# Patient Record
Sex: Female | Born: 1980 | Race: Black or African American | Hispanic: No | Marital: Married | Smoking: Never smoker
Health system: Southern US, Community
[De-identification: ages and names within clinical notes are randomized; demographics above are authoritative.]

## PROBLEM LIST (undated history)

## (undated) DIAGNOSIS — R87619 Unspecified abnormal cytological findings in specimens from cervix uteri: Secondary | ICD-10-CM

## (undated) DIAGNOSIS — O139 Gestational [pregnancy-induced] hypertension without significant proteinuria, unspecified trimester: Secondary | ICD-10-CM

## (undated) DIAGNOSIS — L309 Dermatitis, unspecified: Secondary | ICD-10-CM

## (undated) DIAGNOSIS — IMO0002 Reserved for concepts with insufficient information to code with codable children: Secondary | ICD-10-CM

## (undated) DIAGNOSIS — I10 Essential (primary) hypertension: Secondary | ICD-10-CM

## (undated) HISTORY — DX: Dermatitis, unspecified: L30.9

## (undated) HISTORY — PX: NO PAST SURGERIES: SHX2092

---
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
## 2000-05-28 ENCOUNTER — Ambulatory Visit (HOSPITAL_COMMUNITY): Admission: EM | Admit: 2000-05-28 | Discharge: 2000-05-28 | Payer: Self-pay | Admitting: *Deleted

## 2000-10-03 ENCOUNTER — Emergency Department (HOSPITAL_COMMUNITY): Admission: EM | Admit: 2000-10-03 | Discharge: 2000-10-04 | Payer: Self-pay

## 2000-11-23 ENCOUNTER — Encounter: Payer: Self-pay | Admitting: Emergency Medicine

## 2000-11-23 ENCOUNTER — Emergency Department (HOSPITAL_COMMUNITY): Admission: EM | Admit: 2000-11-23 | Discharge: 2000-11-24 | Payer: Self-pay | Admitting: Emergency Medicine

## 2001-01-06 ENCOUNTER — Encounter: Admission: RE | Admit: 2001-01-06 | Discharge: 2001-03-17 | Payer: Self-pay | Admitting: Family Medicine

## 2001-03-07 ENCOUNTER — Emergency Department (HOSPITAL_COMMUNITY): Admission: EM | Admit: 2001-03-07 | Discharge: 2001-03-07 | Payer: Self-pay | Admitting: Emergency Medicine

## 2001-11-06 ENCOUNTER — Emergency Department (HOSPITAL_COMMUNITY): Admission: EM | Admit: 2001-11-06 | Discharge: 2001-11-06 | Payer: Self-pay | Admitting: Emergency Medicine

## 2002-09-15 ENCOUNTER — Emergency Department (HOSPITAL_COMMUNITY): Admission: EM | Admit: 2002-09-15 | Discharge: 2002-09-15 | Payer: Self-pay | Admitting: Emergency Medicine

## 2002-12-24 ENCOUNTER — Encounter: Admission: RE | Admit: 2002-12-24 | Discharge: 2002-12-24 | Payer: Self-pay | Admitting: Psychiatry

## 2004-04-10 ENCOUNTER — Emergency Department (HOSPITAL_COMMUNITY): Admission: EM | Admit: 2004-04-10 | Discharge: 2004-04-10 | Payer: Self-pay | Admitting: Emergency Medicine

## 2009-11-16 ENCOUNTER — Encounter: Admission: RE | Admit: 2009-11-16 | Discharge: 2009-11-16 | Payer: Self-pay | Admitting: Family Medicine

## 2010-02-08 LAB — TYPE AND SCREEN: Antibody Screen: NEGATIVE

## 2010-02-08 LAB — ABO/RH: RH Type: POSITIVE

## 2010-02-08 LAB — HIV ANTIBODY (ROUTINE TESTING W REFLEX): HIV: NONREACTIVE

## 2010-02-18 NOTE — L&D Delivery Note (Signed)
Delivery Note At 3:42 PM a viable female was delivered via Vaginal, Spontaneous Delivery (Presentation: Left Occiput Anterior).  APGAR: 9, 9; weight 7 lb 2.5 oz (3246 g).   Placenta status: Intact, Spontaneous.  Cord: 3 vessels with the following complications: None.  Anesthesia: None  Episiotomy: None Lacerations: None Suture Repair: none Est. Blood Loss (mL): 300cc  Mom to postpartum.  Baby to nursery-stable.  Alison Murillo Y 09/27/2010, 2:02 AM

## 2010-03-11 ENCOUNTER — Encounter: Payer: Self-pay | Admitting: Obstetrics and Gynecology

## 2010-05-20 ENCOUNTER — Inpatient Hospital Stay (HOSPITAL_COMMUNITY)
Admission: AD | Admit: 2010-05-20 | Discharge: 2010-05-20 | Disposition: A | Discharge status: Home / Self Care | Payer: BC Managed Care – PPO | Source: Ambulatory Visit | Attending: Obstetrics and Gynecology | Admitting: Obstetrics and Gynecology

## 2010-05-20 DIAGNOSIS — R109 Unspecified abdominal pain: Secondary | ICD-10-CM | POA: Insufficient documentation

## 2010-05-20 DIAGNOSIS — O26859 Spotting complicating pregnancy, unspecified trimester: Secondary | ICD-10-CM | POA: Insufficient documentation

## 2010-05-20 LAB — WET PREP, GENITAL
Clue Cells Wet Prep HPF POC: NONE SEEN
Yeast Wet Prep HPF POC: NONE SEEN

## 2010-05-20 LAB — URINE MICROSCOPIC-ADD ON

## 2010-05-20 LAB — URINALYSIS, ROUTINE W REFLEX MICROSCOPIC
Glucose, UA: NEGATIVE mg/dL
Ketones, ur: NEGATIVE mg/dL
Leukocytes, UA: NEGATIVE
Protein, ur: NEGATIVE mg/dL
Urobilinogen, UA: 0.2 mg/dL (ref 0.0–1.0)

## 2010-05-21 LAB — GC/CHLAMYDIA PROBE AMP, GENITAL: GC Probe Amp, Genital: NEGATIVE

## 2010-06-05 ENCOUNTER — Inpatient Hospital Stay (HOSPITAL_COMMUNITY)
Admission: AD | Admit: 2010-06-05 | Discharge: 2010-06-05 | Disposition: A | Discharge status: Home / Self Care | Payer: BC Managed Care – PPO | Source: Ambulatory Visit | Attending: Obstetrics and Gynecology | Admitting: Obstetrics and Gynecology

## 2010-06-05 DIAGNOSIS — O4700 False labor before 37 completed weeks of gestation, unspecified trimester: Secondary | ICD-10-CM | POA: Insufficient documentation

## 2010-06-05 LAB — URINALYSIS, ROUTINE W REFLEX MICROSCOPIC
Glucose, UA: NEGATIVE mg/dL
Hgb urine dipstick: NEGATIVE
Ketones, ur: NEGATIVE mg/dL
Nitrite: NEGATIVE
Protein, ur: NEGATIVE mg/dL
Specific Gravity, Urine: 1.015 (ref 1.005–1.030)
Urobilinogen, UA: 0.2 mg/dL (ref 0.0–1.0)
pH: 6.5 (ref 5.0–8.0)

## 2010-06-05 LAB — WET PREP, GENITAL
Clue Cells Wet Prep HPF POC: NONE SEEN
Trich, Wet Prep: NONE SEEN

## 2010-06-05 LAB — URINE MICROSCOPIC-ADD ON

## 2010-07-06 NOTE — Op Note (Signed)
[facility]  Patient:    Alison Murillo, Alison Murillo                    MRN: [id number] Proc. Date: 05/28/00 Adm. Date:  [id number] Attending:  Ephriam Knuckles H                           Operative Report  PREOPERATIVE DIAGNOSIS:  Left Bartholins abscess.  POSTOPERATIVE DIAGNOSIS:  Left Bartholins abscess.  PROCEDURE:  Marsupialization, left Bartholins abscess.  SURGEON:  Sherry A. Rosalio Macadamia, M.D.  ANESTHESIA:  General.  INDICATIONS:  This is a 31 year old G1, P1-0-0-1 who has had left labial pain and swelling for two days.  The pain got excruciatingly severe on April 9th. Patient was seen in the emergency room for evaluation.  Left labial Bartholins abscess was diagnosed and patient is brought to the operating room for marsupialization.  FINDINGS:  Left labial Bartholins abscess, approximately 5 cm.  PROCEDURE:  Patient was brought into the operating room and given adequate general anesthesia.  She was placed in the dorsal lithotomy position.  Her perineum was washed with Betadine.  A speculum was placed within the vagina. Wet prep was obtained.  GC and Chlamydia culture of the cervix was obtained. Speculum was removed.  The abscess was evaluated.  It was felt that the left labia minora could be opened on the vaginal side for drainage.  An elliptical incision was made approximately 2 to 3 cm.  The epithelium was excised.  The underlying tissues were then incised in a linear fashion.  Large amount of pus and blood were evacuated.  Culture was obtained.  Using #4-0 chromic, the tissues were marsupialized for closure in interrupted stitches for adequate hemostasis.  The abscess was left open in this fashion.  Adequate hemostasis was present.  Patient was taken out of the dorsal lithotomy position.  She was awakened.  She was extubated.  She was moved from the operating table to a stretcher in stable condition.  Complications were none.  Estimated blood loss:   Less than 5 cc. DD:  05/28/00 TD:  05/28/00 Job: [id number]

## 2010-09-13 ENCOUNTER — Ambulatory Visit (HOSPITAL_COMMUNITY)
Admission: RE | Admit: 2010-09-13 | Discharge: 2010-09-13 | Disposition: A | Discharge status: Home / Self Care | Payer: BC Managed Care – PPO | Source: Ambulatory Visit | Attending: Obstetrics and Gynecology | Admitting: Obstetrics and Gynecology

## 2010-09-17 NOTE — Progress Notes (Signed)
MFM Consultation Note  Ms. Bradway is a 30 year old AA female at 36+ weeks who presents for consultation regarding low platelets. Her initial prenatal labs are unavailable but Ms. Pettie reports having a platelet count of 138K at "5 months". Recent platelet counts have been 104K on 07/13 and 91K on 07/20. A recent 24 hour urine for total protein returned with 96 mg/day and her blood pressure today was 116/75. Ms. Ewton denies epistaxis, gingival bleeding, easy bruisability or petechiae. She also denies having a history of low platelet counts. She has no other medical conditions except for mild hypertension which requires no medication at this time. Her OB history consists of an uneventful full term vaginal delivery many years ago. The baby was adopted.  Ms. Dehnert clinical course is consistent with gestational thrombocytopenia and meets the antenatal criteria including mild and asymptomatic, no past history and occuring in the late third trimester. Also, the platelet count is rarely lower than 70K. If the count should fall below 70K, immune thrombocytopenia purpura (ITP) needs to be considered. The major concern with low platelets near delivery is the ability to receive regional anesthesia. The goal is to raise the count above the platelet count threshold used by the anesthesia department for placement of epidural catheters.  Assessment:  1) IUP at 36+ weeks 2) Gestational thrombocytopenia 3) Mild chronic hypertension  Recommendations: 1) Check platelet count at next office visit 2) If > ~100k, check weekly 3) If < ~100K, would give a short course of steroids (1 mg/kg or ~80 mg per day in the morning for 5 days), then follow platelets for a response 4) Obtain weekly platelet counts until delivery 5) Platelet counts need to be monitored postpartum until they are within the normal range 6) Can repeat steroid course if necessary  (Face-to-face consultation with patient: 30 min)

## 2010-09-25 ENCOUNTER — Other Ambulatory Visit: Payer: Self-pay | Admitting: Obstetrics and Gynecology

## 2010-09-26 ENCOUNTER — Inpatient Hospital Stay (HOSPITAL_COMMUNITY)
Admission: RE | Admit: 2010-09-26 | Discharge: 2010-09-28 | DRG: 372 | Disposition: A | Discharge status: Home / Self Care | Payer: BC Managed Care – PPO | Source: Ambulatory Visit | Attending: Obstetrics and Gynecology | Admitting: Obstetrics and Gynecology

## 2010-09-26 ENCOUNTER — Encounter (HOSPITAL_COMMUNITY): Payer: Self-pay

## 2010-09-26 DIAGNOSIS — D696 Thrombocytopenia, unspecified: Secondary | ICD-10-CM

## 2010-09-26 DIAGNOSIS — O10919 Unspecified pre-existing hypertension complicating pregnancy, unspecified trimester: Secondary | ICD-10-CM

## 2010-09-26 DIAGNOSIS — O1002 Pre-existing essential hypertension complicating childbirth: Principal | ICD-10-CM | POA: Diagnosis present

## 2010-09-26 DIAGNOSIS — D689 Coagulation defect, unspecified: Secondary | ICD-10-CM | POA: Diagnosis present

## 2010-09-26 HISTORY — DX: Reserved for concepts with insufficient information to code with codable children: IMO0002

## 2010-09-26 HISTORY — DX: Unspecified abnormal cytological findings in specimens from cervix uteri: R87.619

## 2010-09-26 HISTORY — DX: Gestational (pregnancy-induced) hypertension without significant proteinuria, unspecified trimester: O13.9

## 2010-09-26 HISTORY — DX: Essential (primary) hypertension: I10

## 2010-09-26 LAB — CBC
MCHC: 33 g/dL (ref 30.0–36.0)
Platelets: 76 10*3/uL — ABNORMAL LOW (ref 150–400)
RDW: 14.1 % (ref 11.5–15.5)
WBC: 12.2 10*3/uL — ABNORMAL HIGH (ref 4.0–10.5)

## 2010-09-26 LAB — COMPREHENSIVE METABOLIC PANEL
ALT: 36 U/L — ABNORMAL HIGH (ref 0–35)
Albumin: 2.6 g/dL — ABNORMAL LOW (ref 3.5–5.2)
Calcium: 9.1 mg/dL (ref 8.4–10.5)
GFR calc Af Amer: >60 mL/min (ref >60)
Glucose, Bld: 91 mg/dL (ref 70–99)
Sodium: 134 mEq/L — ABNORMAL LOW (ref 135–145)
Total Protein: 6.4 g/dL (ref 6.0–8.3)

## 2010-09-26 LAB — RPR: RPR Ser Ql: NONREACTIVE

## 2010-09-26 MED ORDER — ONDANSETRON HCL 4 MG/2ML IJ SOLN: 4.0000 mg | INTRAMUSCULAR | Status: DC | PRN

## 2010-09-26 MED ORDER — BENZOCAINE-MENTHOL 20-0.5 % EX AERO: 1.0000 "application " | INHALATION_SPRAY | CUTANEOUS | Status: DC | PRN

## 2010-09-26 MED ORDER — TERBUTALINE SULFATE 1 MG/ML IJ SOLN: 0.2500 mg | Freq: Once | INTRAMUSCULAR | Status: AC | PRN

## 2010-09-26 MED ORDER — ACETAMINOPHEN 325 MG PO TABS: 650.0000 mg | ORAL_TABLET | ORAL | Status: DC | PRN

## 2010-09-26 MED ORDER — LANOLIN HYDROUS EX OINT: TOPICAL_OINTMENT | CUTANEOUS | Status: DC | PRN

## 2010-09-26 MED ORDER — OXYTOCIN 20 UNITS IN LACTATED RINGERS INFUSION - SIMPLE
1.0000 m[IU]/min | INTRAVENOUS | Status: DC
  Administered 2010-09-26: 2 m[IU]/min via INTRAVENOUS
  Administered 2010-09-26: 6 m[IU]/min via INTRAVENOUS
  Filled 2010-09-26: qty 1000

## 2010-09-26 MED ORDER — FLEET ENEMA 7-19 GM/118ML RE ENEM: 1.0000 | ENEMA | RECTAL | Status: DC | PRN

## 2010-09-26 MED ORDER — ONDANSETRON HCL 4 MG/2ML IJ SOLN: 4.0000 mg | Freq: Four times a day (QID) | INTRAMUSCULAR | Status: DC | PRN

## 2010-09-26 MED ORDER — IBUPROFEN 600 MG PO TABS: 600.0000 mg | ORAL_TABLET | Freq: Four times a day (QID) | ORAL | Status: DC | PRN

## 2010-09-26 MED ORDER — NALBUPHINE SYRINGE 5 MG/0.5 ML
5.0000 mg | INJECTION | Freq: Once | INTRAMUSCULAR | Status: AC
  Administered 2010-09-26: 5 mg via INTRAVENOUS
  Filled 2010-09-26: qty 0.5

## 2010-09-26 MED ORDER — LACTATED RINGERS IV SOLN
INTRAVENOUS | Status: DC
  Administered 2010-09-26: 08:00:00 via INTRAVENOUS

## 2010-09-26 MED ORDER — OXYCODONE-ACETAMINOPHEN 5-325 MG PO TABS: 2.0000 | ORAL_TABLET | ORAL | Status: DC | PRN

## 2010-09-26 MED ORDER — WITCH HAZEL-GLYCERIN EX PADS: 1.0000 "application " | MEDICATED_PAD | CUTANEOUS | Status: DC | PRN

## 2010-09-26 MED ORDER — OXYCODONE-ACETAMINOPHEN 5-325 MG/5ML PO SOLN
5.0000 mL | ORAL | Status: DC | PRN
  Administered 2010-09-26 – 2010-09-28 (×5): 5 mL via ORAL
  Filled 2010-09-26 (×6): qty 5

## 2010-09-26 MED ORDER — SIMETHICONE 80 MG PO CHEW: 80.0000 mg | CHEWABLE_TABLET | ORAL | Status: DC | PRN

## 2010-09-26 MED ORDER — OXYTOCIN 20 UNITS IN LACTATED RINGERS INFUSION - SIMPLE
125.0000 mL/h | Freq: Once | INTRAVENOUS | Status: AC
  Administered 2010-09-26: 125 mL/h via INTRAVENOUS

## 2010-09-26 MED ORDER — ONDANSETRON HCL 4 MG PO TABS: 4.0000 mg | ORAL_TABLET | ORAL | Status: DC | PRN

## 2010-09-26 MED ORDER — TETANUS-DIPHTH-ACELL PERTUSSIS 5-2.5-18.5 LF-MCG/0.5 IM SUSP
0.5000 mL | Freq: Once | INTRAMUSCULAR | Status: AC
  Administered 2010-09-27: 0.5 mL via INTRAMUSCULAR
  Filled 2010-09-26: qty 0.5

## 2010-09-26 MED ORDER — LACTATED RINGERS IV SOLN: 500.0000 mL | INTRAVENOUS | Status: DC | PRN

## 2010-09-26 MED ORDER — DIBUCAINE 1 % RE OINT: 1.0000 "application " | TOPICAL_OINTMENT | RECTAL | Status: DC | PRN

## 2010-09-26 MED ORDER — OXYCODONE-ACETAMINOPHEN 5-325 MG PO TABS: 1.0000 | ORAL_TABLET | ORAL | Status: DC | PRN

## 2010-09-26 MED ORDER — PRENATAL PLUS 27-1 MG PO TABS
1.0000 | ORAL_TABLET | Freq: Every day | ORAL | Status: DC
  Filled 2010-09-26 (×2): qty 1

## 2010-09-26 MED ORDER — IBUPROFEN 600 MG PO TABS: 600.0000 mg | ORAL_TABLET | Freq: Four times a day (QID) | ORAL | Status: DC

## 2010-09-26 MED ORDER — DIPHENHYDRAMINE HCL 25 MG PO CAPS: 25.0000 mg | ORAL_CAPSULE | Freq: Four times a day (QID) | ORAL | Status: DC | PRN

## 2010-09-26 MED ORDER — NALBUPHINE SYRINGE 5 MG/0.5 ML
10.0000 mg | INJECTION | Freq: Four times a day (QID) | INTRAMUSCULAR | Status: DC | PRN
  Administered 2010-09-26: 10 mg via INTRAVENOUS
  Filled 2010-09-26 (×2): qty 1

## 2010-09-26 MED ORDER — SENNOSIDES-DOCUSATE SODIUM 8.6-50 MG PO TABS: 2.0000 | ORAL_TABLET | Freq: Every day | ORAL | Status: DC

## 2010-09-26 MED ORDER — LIDOCAINE HCL (PF) 1 % IJ SOLN
30.0000 mL | INTRAMUSCULAR | Status: DC | PRN
  Filled 2010-09-26: qty 30

## 2010-09-26 MED ORDER — ACETAMINOPHEN 160 MG/5ML PO SOLN
650.0000 mg | Freq: Four times a day (QID) | ORAL | Status: DC | PRN
  Administered 2010-09-27: 649.6 mg via ORAL
  Filled 2010-09-26: qty 20.3

## 2010-09-26 MED ORDER — CITRIC ACID-SODIUM CITRATE 334-500 MG/5ML PO SOLN: 30.0000 mL | ORAL | Status: DC | PRN

## 2010-09-26 MED ORDER — ZOLPIDEM TARTRATE 5 MG PO TABS: 5.0000 mg | ORAL_TABLET | Freq: Every evening | ORAL | Status: DC | PRN

## 2010-09-26 MED ORDER — LACTATED RINGERS IV SOLN: INTRAVENOUS | Status: DC

## 2010-09-26 NOTE — Progress Notes (Signed)
Alison Murillo is a 30 y.o. G2P1001 at [redacted]w[redacted]d undergoing induction secondary to favorable cervix, multip and thrombocytopenia.  Subjective: No complaints  Objective: BP 143/76  Pulse 67  Temp(Src) 98.4 F (36.9 C) (Oral)  Resp 20  Ht 5\' 6"  (1.676 m)  Wt 88.905 kg (196 lb)  BMI 31.64 kg/m2  LMP 12/27/2009      FHT:  FHR: 140s bpm, variability: moderate,  accelerations:  Present,  decelerations:  Absent UC:   regular, every 2-3 minutes SVE:   Dilation: 3-4 Effacement (%): 80 Station: -1 Exam by:: Dr. Su Hilt AROM clear fluid and IUPC placed without difficulty  Labs: Lab Results  Component Value Date   WBC 12.2* 09/26/2010   HGB 10.4* 09/26/2010   HCT 31.5* 09/26/2010   MCV 78.4 09/26/2010   PLT 76* 09/26/2010    Assessment / Plan: Induction of labor due to term with favorable cervix and thrombocytopenia,  progressing well on pitocin.  Labor: Progressing on Pitocin, will continue to increase per protocol Fetal Wellbeing:  Category I Pain Control:  IV pain medicine Anticipated MOD:  NSVD  Alison Murillo Y 09/26/2010, 12:24 PM

## 2010-09-26 NOTE — Progress Notes (Signed)
Called to assist d/t inconsolable crying even with STS, no successful latch.  Infant had been using passi.  Educated about use of passi's and artificial nipples prior to establishing BF and advised to wait until BF well established.  Assisted with latch onto left breast, cross-cradle, skin-to-skin.  LS-6.  Flat nipples, infant would come off crying and would re-latch.  Infant fed for 25 minutes on left breast.  Minimal audible swallows heard with compressions.  Hand pump given to pre-pump prior to latching on right breast.  Parents independently latched infant onto right breast.  LS 7.  Shells given.  Dropper given and explained use for any EBM obtained.  Parents appreciative of assistance.  Encouraged to call for further assistance if needed.

## 2010-09-26 NOTE — H&P (Signed)
  Subjective: Comfortable, no pain. Platelet count just resulted = 76.  Objective: BP 153/93  Pulse 68  Temp 98.5 F (36.9 C)  Resp 20  Ht 5\' 6"  (1.676 m)  Wt 88.905 kg (196 lb)  BMI 31.64 kg/m2  LMP 12/27/2009      FHT:  FHR: 140s bpm, variability: moderate,  accelerations:  Present,  decelerations:  Absent UC:   regular, every 5-6 minutes  Labs: Lab Results  Component Value Date   WBC 12.2* 09/26/2010   HGB 10.4* 09/26/2010   HCT 31.5* 09/26/2010   MCV 78.4 09/26/2010   PLT 76* 09/26/2010    Assessment / Plan: Reviewed platelet count with patient and husband.  She will use IV pain medication as needed. Dr. Su Hilt notifed. Anesthesia notified.   Chanie Soucek L 09/26/2010, 9:19 AM
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
## 2010-09-27 LAB — CBC
HCT: 28.7 % — ABNORMAL LOW (ref 36.0–46.0)
Hemoglobin: 9.3 g/dL — ABNORMAL LOW (ref 12.0–15.0)
MCH: 25.3 pg — ABNORMAL LOW (ref 26.0–34.0)
MCHC: 32.4 g/dL (ref 30.0–36.0)
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
## 2010-09-27 NOTE — Progress Notes (Signed)
Post Partum Day 1 Subjective: no complaints.  Up ad lib without syncope or dizziness.  Breastfeeding.  Denies HA, visual sx, epigastric pain.  Objective: Blood pressure 134/83, pulse 67, temperature 98.4 F (36.9 C), temperature source Oral, resp. rate 16, height 5\' 6"  (1.676 m), weight 88.905 kg (196 lb), last menstrual period 12/27/2009, SpO2 98.00%, unknown if currently breastfeeding. BP range through night 128-139/77-85  Physical Exam:  General: alert Lochia: appropriate Uterine Fundus: firm Incision: Intact perineum DVT Evaluation: No evidence of DVT seen on physical exam.   Basename 09/27/10 0525 09/26/10 0830  HGB 9.3* 10.4*  HCT 28.7* 31.5*    Assessment/Plan: PP day 1--stable Thrombocytopenia Chronic HTN, no meds Anticipate d/c tomorrow. Re-check CBC in am Plan follow-up on platelets pp. Plan for discharge tomorrow.    LOS: 1 day   Dashel Goines L 09/27/2010, 9:11 AM

## 2010-09-27 NOTE — Progress Notes (Signed)
Observed infant feeding for 15 mins. Slight pinching of nipple, inst to reposition chin for deeper latch.

## 2010-09-28 LAB — CBC
MCH: 25.4 pg — ABNORMAL LOW (ref 26.0–34.0)
MCV: 77.9 fL — ABNORMAL LOW (ref 78.0–100.0)
Platelets: 74 10*3/uL — ABNORMAL LOW (ref 150–400)
RBC: 3.89 MIL/uL (ref 3.87–5.11)
RDW: 14.3 % (ref 11.5–15.5)

## 2010-09-28 MED ORDER — BENZOCAINE-MENTHOL 20-0.5 % EX AERO
INHALATION_SPRAY | CUTANEOUS | Status: AC
  Administered 2010-09-28: 11:00:00
  Filled 2010-09-28: qty 56

## 2010-09-28 MED ORDER — OXYCODONE-ACETAMINOPHEN 5-325 MG/5ML PO SOLN: 5.0000 mL | ORAL | Status: AC | PRN

## 2010-09-28 NOTE — Discharge Summary (Signed)
Obstetric Discharge Summary Reason for Admission: induction of labor due to gestational thrombocytopenia and chronic htn Prenatal Procedures: NST, U/S Intrapartum Procedures: spontaneous vaginal delivery Postpartum Procedures: none Complications-Operative and Postpartum: none Platelet ct to 76 before delivery, 65 pp d#1, 74 day of discharge Hemoglobin  Date Value Range Status  09/28/2010 9.9* 12.0-15.0 (g/dL) Final     HCT  Date Value Range Status  09/28/2010 30.3* 36.0-46.0 (%) Final    Discharge Diagnoses: Term Pregnancy-delivered and gestational thrombocytopenia and chronic htn  Discharge Information: Date: 09/28/2010 Activity: per booklet Diet: routine Medications: Percocet liquid Condition: stable Instructions: refer to practice specific booklet Discharge to: home Follow-up Information    Follow up with [facility] [place] [facility]. (keep scheduled appointment at [facility].  Follow-up with Dr. Alwyn Pea in 2 weeks for BP re-check.)         Plan repeat platelet evalution at 6 weeks at [facility]    Newborn Data: Live born female  Birth Weight: 7 lb 2.5 oz (3246 g) APGAR: 9, 9  Home with mother.  Rachna Schonberger L 09/28/2010, 8:23 AM

## 2010-09-28 NOTE — Progress Notes (Signed)
Post Partum Day 2 Subjective: no complaints.  Ready for discharge.  Breast feeding.  Undecided on birth control--considering Mirena or Nexplanon.  No HA, visual sx, or epigastric pain.    Objective: Blood pressure 149/82, pulse 75, temperature 97.7 F (36.5 C), temperature source Oral, resp. rate 18, height 5\' 6"  (1.676 m), weight 88.905 kg (196 lb), last menstrual period 12/27/2009, SpO2 98.00%, unknown if currently breastfeeding. BP range 130-149-77-90  Physical Exam:  General: alert Lochia: appropriate Uterine Fundus: firm Incision: healing well DVT Evaluation: No evidence of DVT seen on physical exam.   Basename 09/28/10 0535 09/27/10 0525  HGB 9.9* 9.3*  HCT 30.3* 28.7*    Assessment/Plan: PP day 2 Chronic HTN--no meds since 2nd trimester Gestational thrombocytopenia  Discharge home Follow-up with primary MD, Dr. Alwyn Pea, in 2 weeks for BP check HTN precautions reviewed. [facility] will follow-up on platelet count at 6 week visit. Rx liquid Percocet--patient intolerant of tablets, and unable to take Motrin due to thrombocytopenia.   LOS: 2 days   Kandance Yano L 09/28/2010, 8:18 AM

## 2011-06-20 ENCOUNTER — Telehealth: Payer: Self-pay | Admitting: Obstetrics and Gynecology

## 2011-06-20 NOTE — Telephone Encounter (Signed)
Routed to Alison Murillo

## 2011-06-21 ENCOUNTER — Telehealth: Payer: Self-pay

## 2011-06-21 NOTE — Telephone Encounter (Signed)
LM for pharmacist at [facility] in [place]. Norethindrone tabs 1 po qd One pack only was called in, as pt's 90 supply shipment was delayed. Pt is aware. Melody Comas A

## 2012-02-11 ENCOUNTER — Other Ambulatory Visit: Payer: Self-pay | Admitting: Obstetrics and Gynecology

## 2012-02-11 DIAGNOSIS — IMO0001 Reserved for inherently not codable concepts without codable children: Secondary | ICD-10-CM

## 2012-02-11 NOTE — Telephone Encounter (Signed)
Spoke with pt rgd rx refill request. Pt stated that she is requesting rx. Made a AEX with [facility] on 03/05/2012. micronor disp 3 packs sig 1po qd with 3 refills. pts voice understanding . bt cma

## 2012-03-05 ENCOUNTER — Ambulatory Visit: Payer: Self-pay | Admitting: Obstetrics and Gynecology

## 2013-12-20 ENCOUNTER — Encounter (HOSPITAL_COMMUNITY): Payer: Self-pay

## 2014-04-28 ENCOUNTER — Other Ambulatory Visit: Payer: Self-pay | Admitting: Gynecology

## 2014-04-28 DIAGNOSIS — N644 Mastodynia: Secondary | ICD-10-CM

## 2014-05-03 ENCOUNTER — Ambulatory Visit
Admission: RE | Admit: 2014-05-03 | Discharge: 2014-05-03 | Disposition: A | Discharge status: Home / Self Care | Payer: BLUE CROSS/BLUE SHIELD | Source: Ambulatory Visit | Attending: Gynecology | Admitting: Gynecology

## 2014-05-03 DIAGNOSIS — N644 Mastodynia: Secondary | ICD-10-CM

## 2015-03-06 ENCOUNTER — Ambulatory Visit (INDEPENDENT_AMBULATORY_CARE_PROVIDER_SITE_OTHER): Payer: BLUE CROSS/BLUE SHIELD | Admitting: Allergy and Immunology

## 2015-03-06 ENCOUNTER — Encounter: Payer: Self-pay | Admitting: Allergy and Immunology

## 2015-03-06 VITALS — BP 120/90 | HR 76 | Temp 98.3°F | Resp 16 | Ht 66.14 in | Wt 150.4 lb

## 2015-03-06 DIAGNOSIS — J310 Chronic rhinitis: Secondary | ICD-10-CM | POA: Diagnosis not present

## 2015-03-06 DIAGNOSIS — R030 Elevated blood-pressure reading, without diagnosis of hypertension: Secondary | ICD-10-CM

## 2015-03-06 DIAGNOSIS — IMO0001 Reserved for inherently not codable concepts without codable children: Secondary | ICD-10-CM | POA: Insufficient documentation

## 2015-03-06 MED ORDER — FLUTICASONE PROPIONATE 50 MCG/ACT NA SUSP: NASAL | Status: AC

## 2015-03-06 NOTE — Patient Instructions (Addendum)
Chronic rhinitis Non-allergic rhinitis.  All seasonal and perennial aeroallergen skin tests are negative despite a positive histamine control.  Intranasal steroids and intranasal antihistamines are effective for symptoms associated with non-allergic rhinitis, whereas second generation antihistamines such as cetirizine, loratadine and fexofenadine have been found to be ineffective for this condition.  A prescription has been provided for fluticasone nasal spray, one spray per nostril 1-2 times daily as needed. Proper nasal spray technique has been discussed and demonstrated.  Nasal saline lavage (NeilMed) as needed has been recommended along with instructions for proper administration.  Elevated blood pressure  Alison Murillo has been asked to follow up with her primary care physician and/or cardiologist regarding this issue.  She has verbalized understanding and has agreed to do so.    Return in about 4 months (around 07/04/2015), or if symptoms worsen or fail to improve.

## 2015-03-06 NOTE — Assessment & Plan Note (Signed)
Non-allergic rhinitis.  All seasonal and perennial aeroallergen skin tests are negative despite a positive histamine control.  Intranasal steroids and intranasal antihistamines are effective for symptoms associated with non-allergic rhinitis, whereas second generation antihistamines such as cetirizine, loratadine and fexofenadine have been found to be ineffective for this condition.  A prescription has been provided for fluticasone nasal spray, one spray per nostril 1-2 times daily as needed. Proper nasal spray technique has been discussed and demonstrated.  Nasal saline lavage (NeilMed) as needed has been recommended along with instructions for proper administration. 

## 2015-03-06 NOTE — Progress Notes (Signed)
New Patient Note  RE: Alison Murillo MRN: [id number] DOB: [birth year] Date of Office Visit: 03/06/2015  Referring provider: No ref. provider found Primary care provider: No PCP Per Patient  Chief Complaint: Nasal Congestion   History of present illness: HPI Comments: Alison Murillo is a 35 y.o. female presents today for evaluation of rhinitis.  She reports that she experiences rhinorrhea "all the time", as well as nasal congestion, sneezing, and coughing. No significant seasonal symptom variation has been noted nor have specific environmental triggers been identified.  These symptoms have progressed over the past 2 years. Cetirizine, fexofenadine, and loratadine have failed to provide adequate symptom relief.  She denies chest tightness, dyspnea, or wheezing.  Based upon elevated blood pressure today she was asked about history of hypertension.  She reports that her cardiologist recently discontinued her antihypertensive medications and that she has recently noted elevated diastolic pressures with self-monitoring.   Assessment and plan: Chronic rhinitis Non-allergic rhinitis.  All seasonal and perennial aeroallergen skin tests are negative despite a positive histamine control.  Intranasal steroids and intranasal antihistamines are effective for symptoms associated with non-allergic rhinitis, whereas second generation antihistamines such as cetirizine, loratadine and fexofenadine have been found to be ineffective for this condition.  A prescription has been provided for fluticasone nasal spray, one spray per nostril 1-2 times daily as needed. Proper nasal spray technique has been discussed and demonstrated.  Nasal saline lavage (NeilMed) as needed has been recommended along with instructions for proper administration.  Elevated blood pressure  Alison Murillo has been asked to follow up with her primary care physician and/or cardiologist regarding this issue.  She has verbalized understanding  and has agreed to do so.    Meds ordered this encounter  Medications  . fluticasone (FLONASE) 50 MCG/ACT nasal spray    Sig: Use one spray in each nostril 1-2 times daily as needed for stuffy nose or drainage    Dispense:  16 g    Refill:  5    Diagnositics: Allergy skin testing: Negative despite a positive histamine control.    Physical examination: Blood pressure 120/90, pulse 76, temperature 98.3 F (36.8 C), temperature source Oral, resp. rate 16, height 5' 6.14" (1.68 m), weight 150 lb 5.7 oz (68.2 kg).  General: Alert, interactive, in no acute distress. HEENT: TMs pearly gray, turbinates edematous without discharge, post-pharynx moderately erythematous. Neck: Supple without lymphadenopathy. Lungs: Clear to auscultation without wheezing, rhonchi or rales. CV: Normal S1, S2 without murmurs. Abdomen: Nondistended, nontender. Skin: Warm and dry, without lesions or rashes. Extremities:  No clubbing, cyanosis or edema. Neuro:   Grossly intact.  Review of systems: Review of Systems  Constitutional: Negative for fever, chills and weight loss.  HENT: Positive for congestion. Negative for nosebleeds.   Eyes: Negative for blurred vision.  Respiratory: Positive for cough. Negative for hemoptysis, shortness of breath and wheezing.   Cardiovascular: Negative for chest pain.  Gastrointestinal: Negative for diarrhea and constipation.  Genitourinary: Negative for dysuria.  Musculoskeletal: Negative for myalgias and joint pain.  Skin: Negative for itching and rash.  Neurological: Negative for dizziness.  Endo/Heme/Allergies: Does not bruise/bleed easily.    Past medical history: Past Medical History  Diagnosis Date  . Hypertension   . Pregnancy induced hypertension   . Abnormal Pap smear   . Eczema     Past surgical history: Past Surgical History  Procedure Laterality Date  . No past surgeries      Family history: Family History  Problem Relation Age  of Onset  .  Asthma Father   . Eczema Sister   . Allergic rhinitis Neg Hx   . Immunodeficiency Neg Hx   . Urticaria Neg Hx     Social history: Social History   Social History  . Marital Status: Married    Spouse Name: N/A  . Number of Children: N/A  . Years of Education: N/A   Occupational History  . Not on file.   Social History Main Topics  . Smoking status: Never Smoker   . Smokeless tobacco: Never Used  . Alcohol Use: No  . Drug Use: No  . Sexual Activity: Yes    Birth Control/ Protection: Inserts   Other Topics Concern  . Not on file   Social History Narrative   Environmental History:  Felipe lives in a 36 year old house with carpeting in the bedroom and central air/heat.  There is a dog in house which has access to her bedroom.  She is a nonsmoker.    Medication List       This list is accurate as of: 03/06/15  6:12 PM.  Always use your most recent med list.               buPROPion 150 MG 24 hr tablet  Commonly known as:  WELLBUTRIN XL  Take 150 mg by mouth 2 (two) times daily.     CHILDRENS CHEWABLE VITAMINS PO  Take 1 tablet by mouth daily. Reported on 03/06/2015     fluticasone 50 MCG/ACT nasal spray  Commonly known as:  FLONASE  Use one spray in each nostril 1-2 times daily as needed for stuffy nose or drainage     norethindrone 0.35 MG tablet  Commonly known as:  MICRONOR,CAMILA,ERRIN  TAKE 1 TABLET DAILY     ORSYTHIA 0.1-20 MG-MCG tablet  Generic drug:  levonorgestrel-ethinyl estradiol  Take 1 tablet by mouth daily.     PAZEO 0.7 % Soln  Generic drug:  Olopatadine HCl  Apply 1 drop to eye daily.        Known medication allergies: No Known Allergies  I appreciate the opportunity to take part in this Latifah's care. Please do not hesitate to contact me with questions.  Sincerely,   R. Jorene Guest, MD

## 2015-03-06 NOTE — Assessment & Plan Note (Signed)
   Marcelino DusterMichelle has been asked to follow up with her primary care physician and/or cardiologist regarding this issue.  She has verbalized understanding and has agreed to do so.

## 2019-12-29 ENCOUNTER — Other Ambulatory Visit: Payer: Self-pay | Admitting: Obstetrics and Gynecology

## 2019-12-29 DIAGNOSIS — Z1231 Encounter for screening mammogram for malignant neoplasm of breast: Secondary | ICD-10-CM

## 2020-02-14 ENCOUNTER — Ambulatory Visit
Admission: RE | Admit: 2020-02-14 | Discharge: 2020-02-14 | Disposition: A | Discharge status: Home / Self Care | Payer: BLUE CROSS/BLUE SHIELD | Source: Ambulatory Visit | Attending: Obstetrics and Gynecology | Admitting: Obstetrics and Gynecology

## 2020-02-14 ENCOUNTER — Other Ambulatory Visit: Payer: Self-pay

## 2020-02-14 DIAGNOSIS — Z1231 Encounter for screening mammogram for malignant neoplasm of breast: Secondary | ICD-10-CM

## 2020-02-16 ENCOUNTER — Other Ambulatory Visit: Payer: Self-pay | Admitting: Obstetrics and Gynecology

## 2020-02-16 DIAGNOSIS — R928 Other abnormal and inconclusive findings on diagnostic imaging of breast: Secondary | ICD-10-CM

## 2020-02-16 DIAGNOSIS — N6320 Unspecified lump in the left breast, unspecified quadrant: Secondary | ICD-10-CM

## 2020-03-02 ENCOUNTER — Ambulatory Visit
Admission: RE | Admit: 2020-03-02 | Discharge: 2020-03-02 | Disposition: A | Discharge status: Home / Self Care | Payer: Medicaid Other | Source: Ambulatory Visit | Attending: Obstetrics and Gynecology | Admitting: Obstetrics and Gynecology

## 2020-03-02 ENCOUNTER — Other Ambulatory Visit: Payer: Self-pay

## 2020-03-02 DIAGNOSIS — R928 Other abnormal and inconclusive findings on diagnostic imaging of breast: Secondary | ICD-10-CM

## 2020-05-27 ENCOUNTER — Ambulatory Visit: Payer: Self-pay

## 2021-06-08 ENCOUNTER — Other Ambulatory Visit: Payer: Self-pay | Admitting: Obstetrics and Gynecology

## 2021-06-08 DIAGNOSIS — Z1231 Encounter for screening mammogram for malignant neoplasm of breast: Secondary | ICD-10-CM

## 2021-06-22 ENCOUNTER — Ambulatory Visit
Admission: RE | Admit: 2021-06-22 | Discharge: 2021-06-22 | Disposition: A | Discharge status: Home / Self Care | Payer: Medicaid Other | Source: Ambulatory Visit | Attending: Obstetrics and Gynecology | Admitting: Obstetrics and Gynecology

## 2021-06-22 DIAGNOSIS — Z1231 Encounter for screening mammogram for malignant neoplasm of breast: Secondary | ICD-10-CM

## 2021-10-05 IMAGING — MG MM DIGITAL DIAGNOSTIC UNILAT*L* W/ TOMO W/ CAD
6 series · 6 of 18 positions shown · non-contrast
Comparison: Previous exam(s).

CLINICAL DATA: The patient was called back for a left breast mass

EXAM:
DIGITAL DIAGNOSTIC LEFT MAMMOGRAM WITH CAD AND TOMO
ULTRASOUND LEFT BREAST

[L MLO synth-2D]
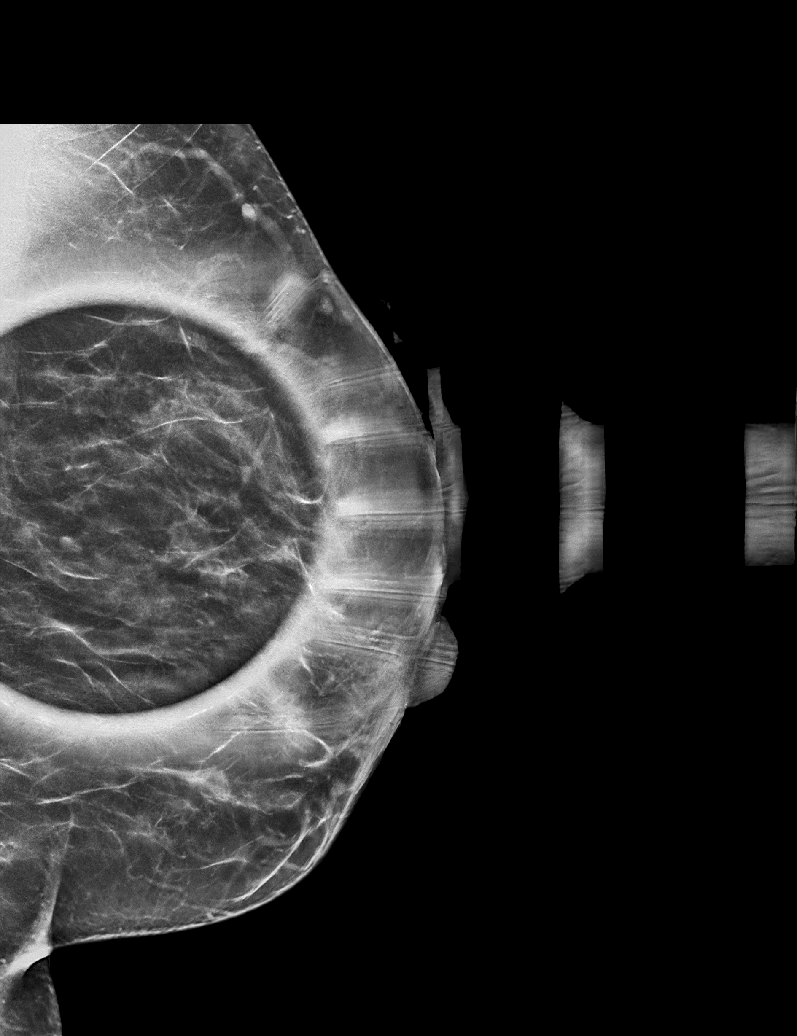

[L CC synth-2D (1 of 2)]
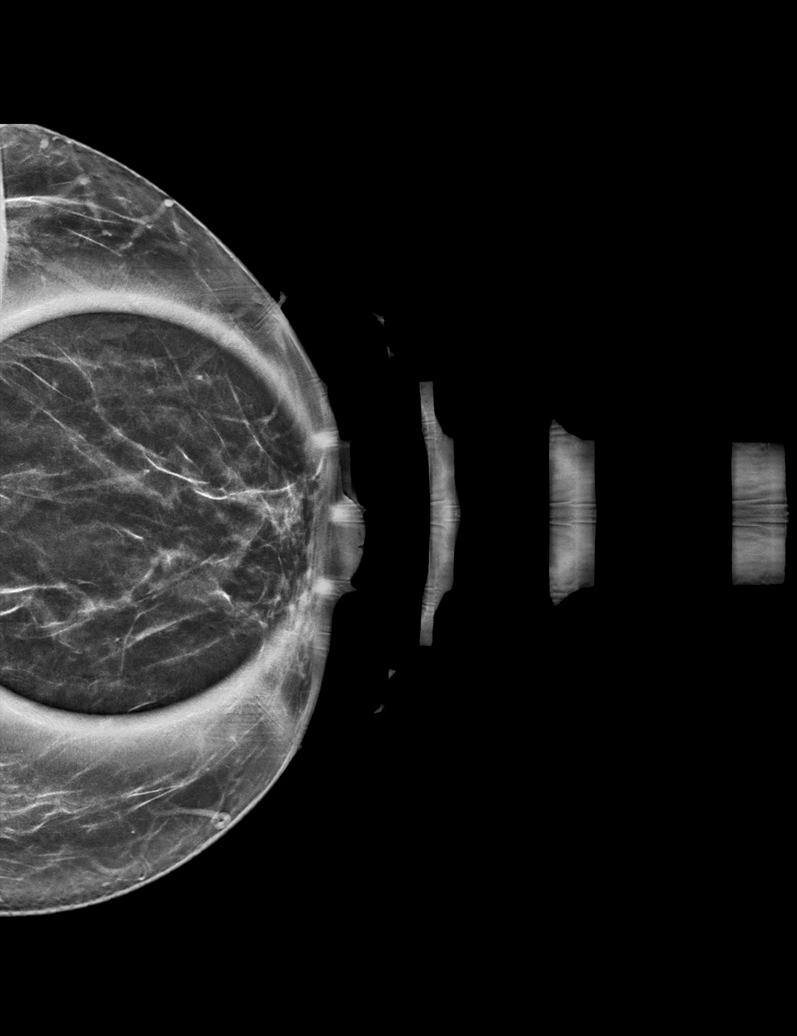

[L CC synth-2D (2 of 2)]
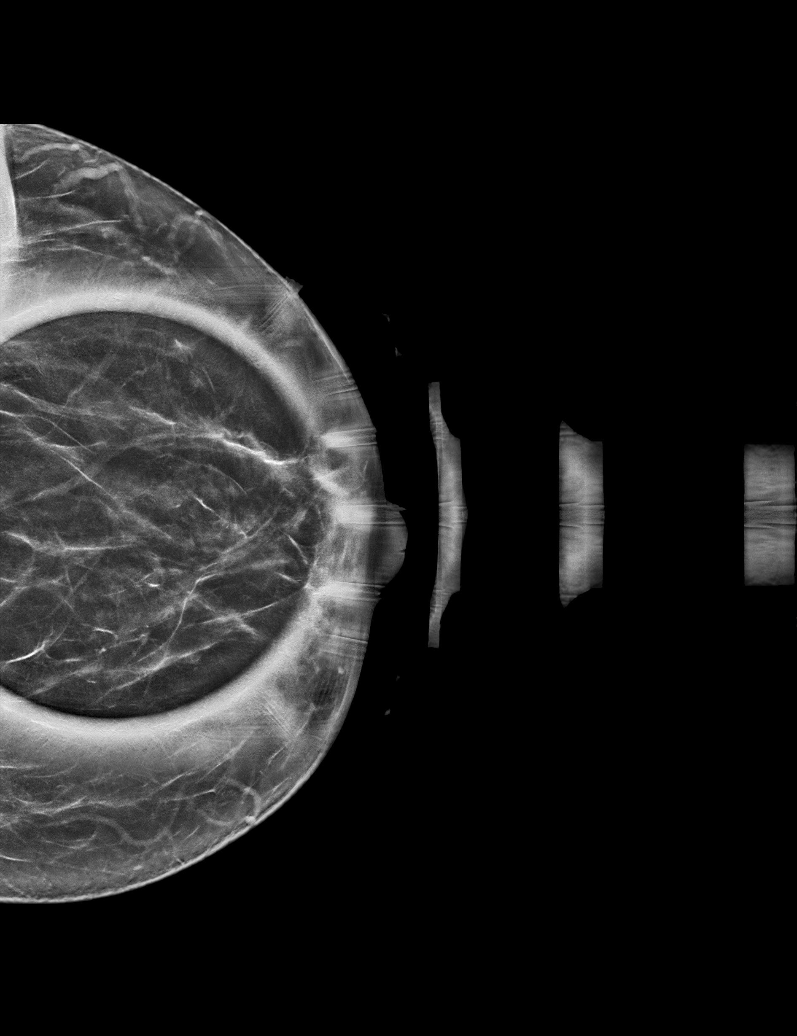

[L CC tomo (1 of 2) · tomo slice 31/61.0]
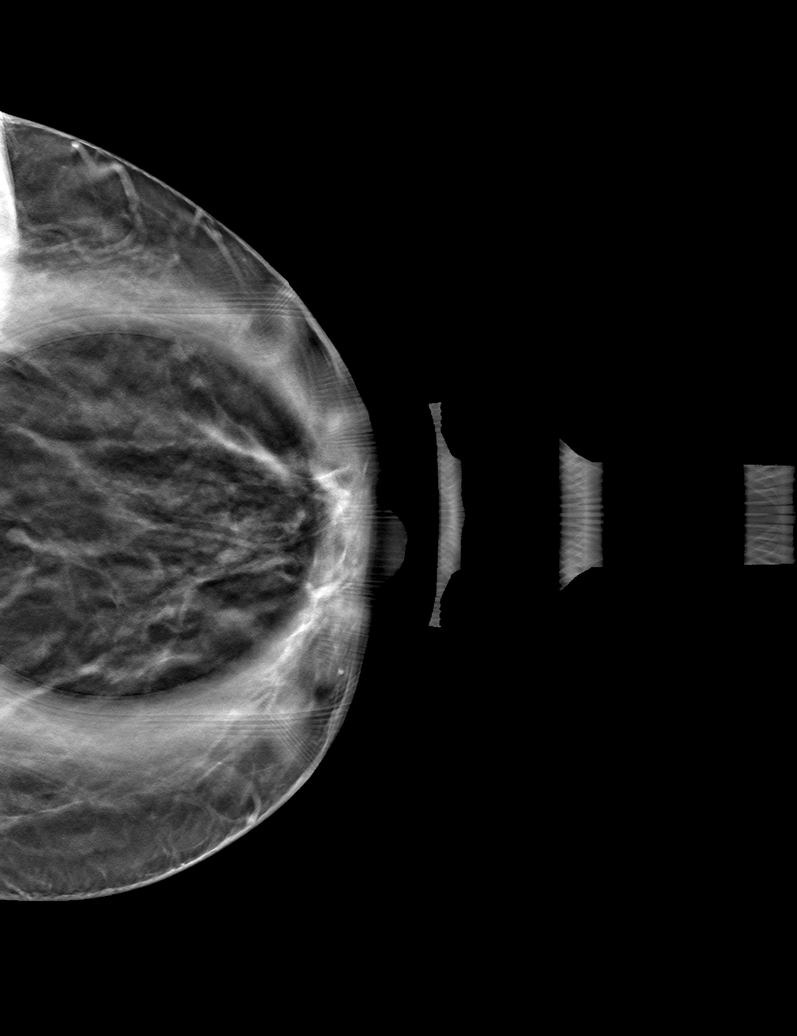

[L CC tomo (2 of 2) · tomo slice 25/48.0]
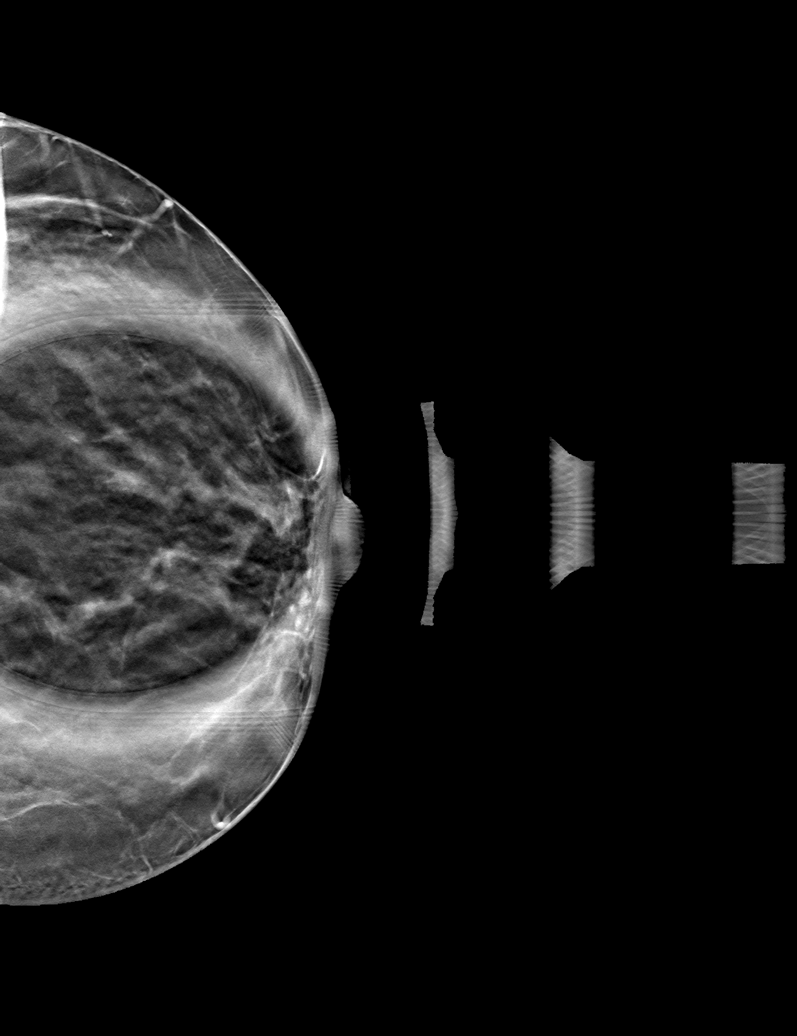

[L MLO tomo · tomo slice 32/63.0]
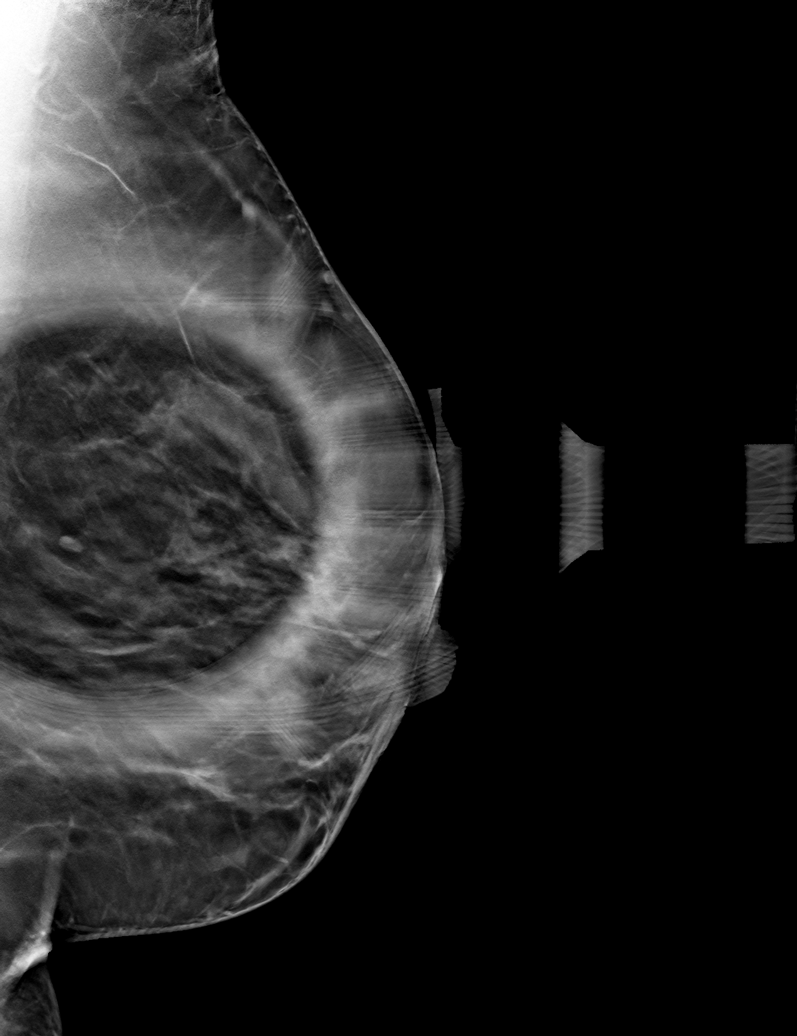

[6 of 18 positions shown; findings below may reference images not displayed]

ACR Breast Density Category b: There are scattered areas of
fibroglandular density.
FINDINGS: The mass in the left breast at 12 o'clock measuring between 4 and 5
mm persists on today's imaging.

Mammographic images were processed with CAD.

On physical exam, no suspicious lumps are identified.

Targeted ultrasound is performed, showing a cyst in the left breast
at 12 o'clock, 3 cm from the nipple measuring up to 4.6 mm,
correlating with the mammographic finding. No evidence of
malignancy.
IMPRESSION: The new mass in the left breast is a cyst requiring no additional
follow-up.

RECOMMENDATION:
Annual screening mammography.

I have discussed the findings and recommendations with the patient.
If applicable, a reminder letter will be sent to the patient
regarding the next appointment.

BI-RADS CATEGORY  2: Benign.

## 2021-10-05 IMAGING — US US BREAST*L* LIMITED INC AXILLA
1 series · 5 of 5 positions shown · non-contrast
Comparison: Previous exam(s).

CLINICAL DATA: The patient was called back for a left breast mass

EXAM:
DIGITAL DIAGNOSTIC LEFT MAMMOGRAM WITH CAD AND TOMO
ULTRASOUND LEFT BREAST

[Series 1: us breast*left* limited inc axilla · 0.06mm/px · 5 of 5 slices shown]
[im 1/5]
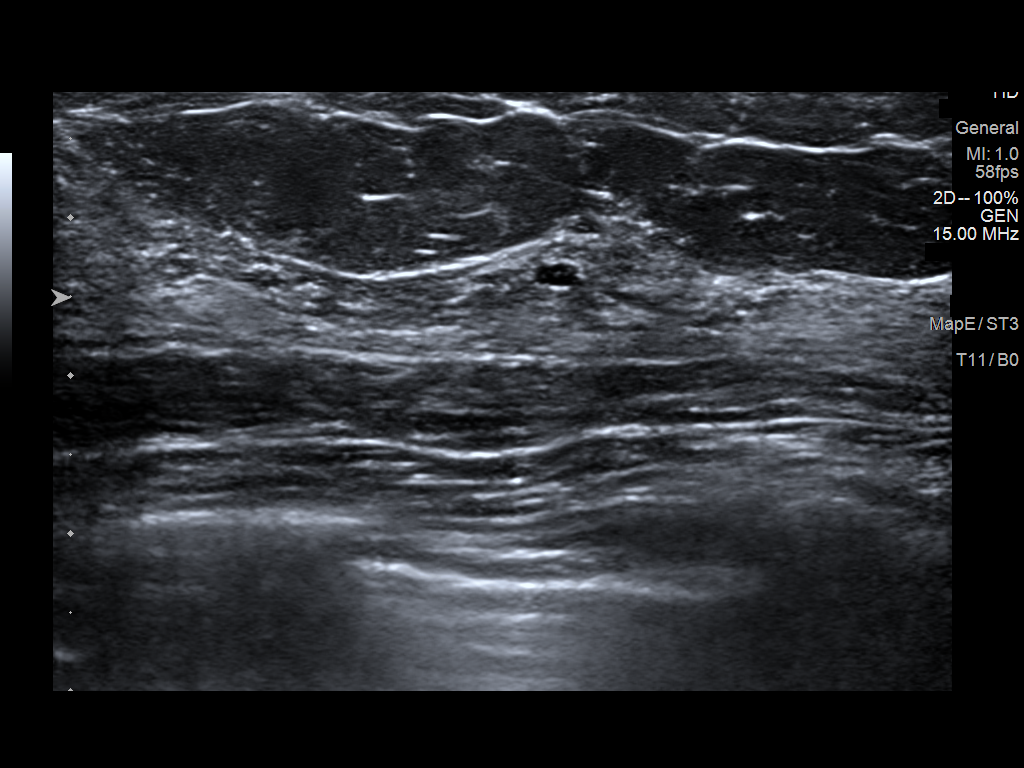
[im 2/5]
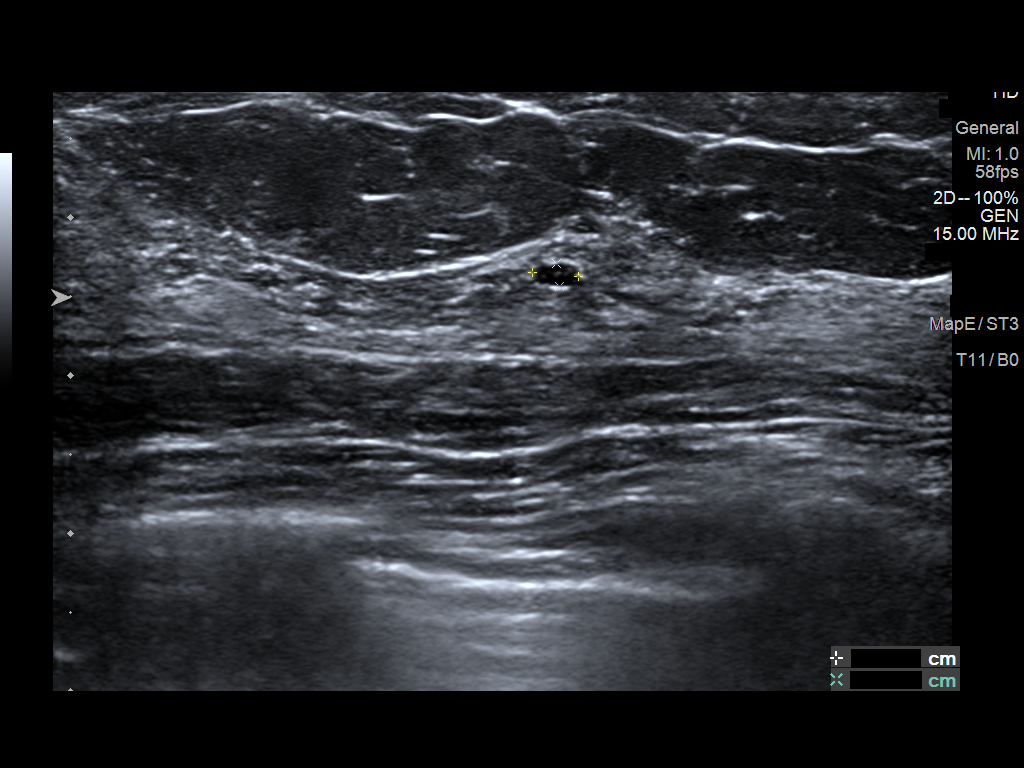
[im 3/5]
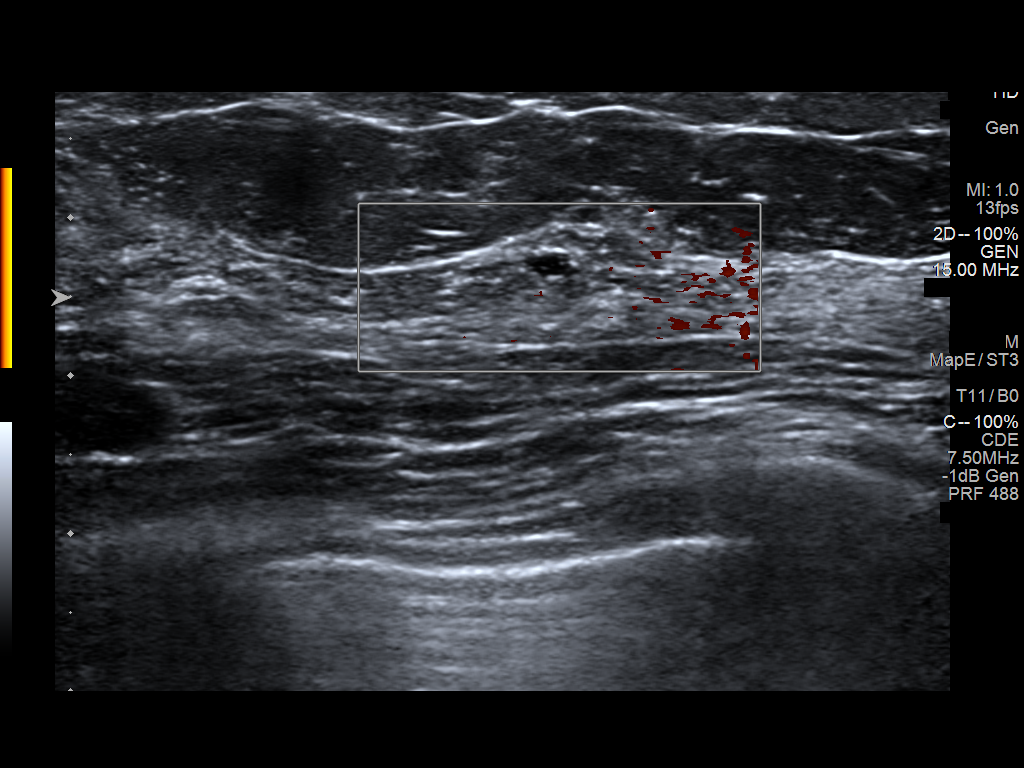
[im 4/5]
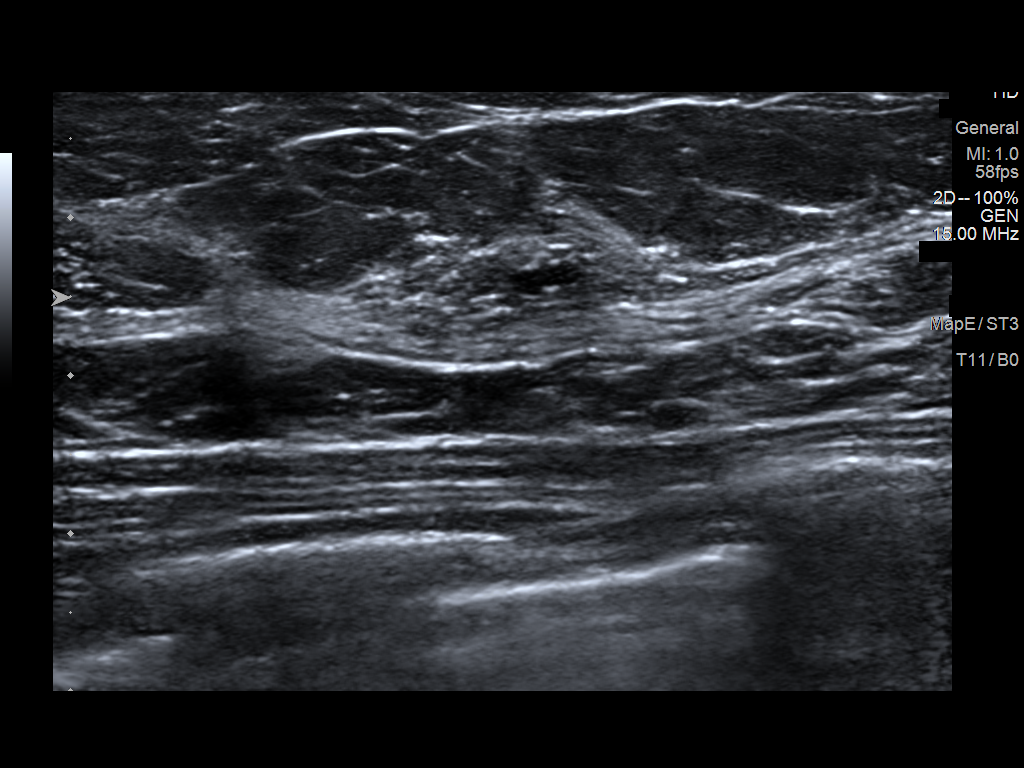
[im 5/5]
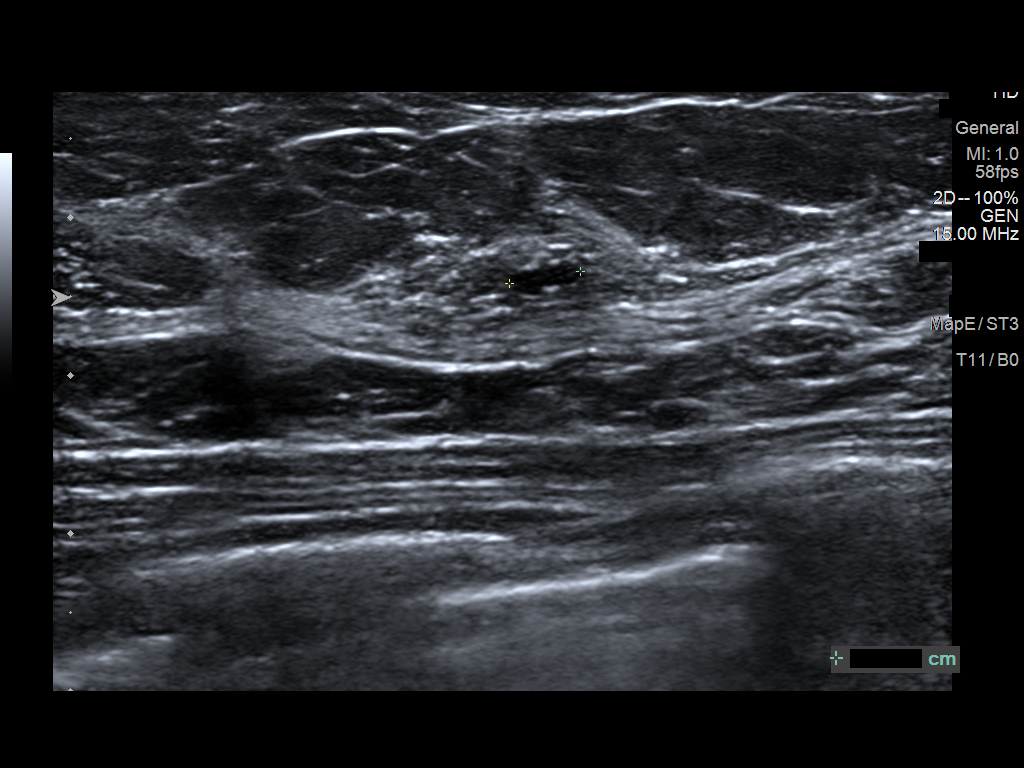

[5 of 5 positions shown; findings below may reference images not displayed]

ACR Breast Density Category b: There are scattered areas of
fibroglandular density.
FINDINGS: The mass in the left breast at 12 o'clock measuring between 4 and 5
mm persists on today's imaging.

Mammographic images were processed with CAD.

On physical exam, no suspicious lumps are identified.

Targeted ultrasound is performed, showing a cyst in the left breast
at 12 o'clock, 3 cm from the nipple measuring up to 4.6 mm,
correlating with the mammographic finding. No evidence of
malignancy.
IMPRESSION: The new mass in the left breast is a cyst requiring no additional
follow-up.

RECOMMENDATION:
Annual screening mammography.

I have discussed the findings and recommendations with the patient.
If applicable, a reminder letter will be sent to the patient
regarding the next appointment.

BI-RADS CATEGORY  2: Benign.

## 2021-11-02 ENCOUNTER — Emergency Department (HOSPITAL_COMMUNITY): Payer: Medicaid Other

## 2021-11-02 ENCOUNTER — Other Ambulatory Visit: Payer: Self-pay

## 2021-11-02 ENCOUNTER — Emergency Department (HOSPITAL_COMMUNITY)
Admission: EM | Admit: 2021-11-02 | Discharge: 2021-11-02 | Disposition: A | Discharge status: Home / Self Care | Payer: Medicaid Other | Attending: Emergency Medicine | Admitting: Emergency Medicine

## 2021-11-02 ENCOUNTER — Encounter (HOSPITAL_COMMUNITY): Payer: Self-pay | Admitting: Emergency Medicine

## 2021-11-02 DIAGNOSIS — R079 Chest pain, unspecified: Secondary | ICD-10-CM

## 2021-11-02 DIAGNOSIS — R531 Weakness: Secondary | ICD-10-CM | POA: Diagnosis not present

## 2021-11-02 DIAGNOSIS — R479 Unspecified speech disturbances: Secondary | ICD-10-CM

## 2021-11-02 DIAGNOSIS — Z79899 Other long term (current) drug therapy: Secondary | ICD-10-CM | POA: Insufficient documentation

## 2021-11-02 DIAGNOSIS — I10 Essential (primary) hypertension: Secondary | ICD-10-CM | POA: Insufficient documentation

## 2021-11-02 DIAGNOSIS — R072 Precordial pain: Secondary | ICD-10-CM | POA: Diagnosis present

## 2021-11-02 DIAGNOSIS — R29898 Other symptoms and signs involving the musculoskeletal system: Secondary | ICD-10-CM

## 2021-11-02 LAB — TROPONIN I (HIGH SENSITIVITY)
Troponin I (High Sensitivity): 2 ng/L (ref <18)
Troponin I (High Sensitivity): <2 ng/L (ref <18)

## 2021-11-02 LAB — BASIC METABOLIC PANEL
Anion gap: 6 (ref 5–15)
BUN: 11 mg/dL (ref 6–20)
CO2: 23 mmol/L (ref 22–32)
Calcium: 9.2 mg/dL (ref 8.9–10.3)
Chloride: 108 mmol/L (ref 98–111)
Creatinine, Ser: 1.04 mg/dL — ABNORMAL HIGH (ref 0.44–1.00)
GFR, Estimated: >60 mL/min (ref >60)
Glucose, Bld: 109 mg/dL — ABNORMAL HIGH (ref 70–99)
Potassium: 3.6 mmol/L (ref 3.5–5.1)
Sodium: 137 mmol/L (ref 135–145)

## 2021-11-02 LAB — CBC
HCT: 44.1 % (ref 36.0–46.0)
Hemoglobin: 14.3 g/dL (ref 12.0–15.0)
MCH: 26.6 pg (ref 26.0–34.0)
MCHC: 32.4 g/dL (ref 30.0–36.0)
MCV: 82.1 fL (ref 80.0–100.0)
Platelets: 179 10*3/uL (ref 150–400)
RBC: 5.37 MIL/uL — ABNORMAL HIGH (ref 3.87–5.11)
RDW: 13.5 % (ref 11.5–15.5)
WBC: 5.2 10*3/uL (ref 4.0–10.5)
nRBC: 0 % (ref 0.0–0.2)

## 2021-11-02 LAB — I-STAT BETA HCG BLOOD, ED (MC, WL, AP ONLY): I-stat hCG, quantitative: <5 m[IU]/mL (ref <5)

## 2021-11-02 LAB — CBG MONITORING, ED: Glucose-Capillary: 106 mg/dL — ABNORMAL HIGH (ref 70–99)

## 2021-11-02 MED ORDER — ALPRAZOLAM 0.5 MG PO TABS: 0.5000 mg | ORAL_TABLET | Freq: Two times a day (BID) | ORAL | 0 refills | Status: AC | PRN

## 2021-11-02 NOTE — ED Provider Notes (Signed)
[facility]ENCY [facility]PT Provider Note   CSN: [id number] Arrival date & time: 11/02/21  2376     History  Chief Complaint  Patient presents with   Chest Pain   Weakness    Alison Murillo is a 41 y.o. female.  She has a history of hypertension.  She noticed some difficulty typing with her left hand starting yesterday.  She said it just did not feel as fluid is normal.  She also noticed some difficulty with enunciation of her speech.  No difficulty in comprehension.  She feels maybe a little bit numb on the left side of her face.  Started yesterday.  Today she had some substernal chest discomfort without any radiation or associated symptoms.  She also noticed some random pains sharp stabbing all over her body that started today. She went to urgent care where they found her blood pressure to be elevated and felt she needed further evaluation here.  No prior history of stroke symptoms neurologic complaints or cardiac disease.  No recent changes in medications, non-smoker, drinks socially.  She does endorse increased stressors.  The history is provided by the patient.  Chest Pain Pain location:  Substernal area Pain quality: pressure   Pain radiates to:  Does not radiate Pain severity:  Mild Onset quality:  Gradual Duration:  1 hour Timing:  Constant Progression:  Unchanged Chronicity:  New Relieved by:  None tried Worsened by:  Nothing Ineffective treatments:  None tried Associated symptoms: numbness and weakness   Associated symptoms: no abdominal pain, no back pain, no cough, no diaphoresis, no dysphagia, no fever, no headache, no nausea, no shortness of breath and no vomiting   Risk factors: hypertension   Weakness Severity:  Mild Onset quality:  Gradual Duration:  2 days Timing:  Constant Progression:  Unchanged Chronicity:  New Context: stress   Relieved by:  None tried Worsened by:  Nothing Ineffective treatments:  None tried Associated symptoms:  chest pain   Associated symptoms: no abdominal pain, no cough, no difficulty walking, no dysphagia, no fever, no headaches, no nausea, no shortness of breath and no vomiting        Home Medications Prior to Admission medications   Medication Sig Start Date End Date Taking? Authorizing Provider  buPROPion (WELLBUTRIN XL) 150 MG 24 hr tablet Take 150 mg by mouth 2 (two) times daily.    [provider]  fluticasone Aleda Grana) 50 MCG/ACT nasal spray Use one spray in each nostril 1-2 times daily as needed for stuffy nose or drainage 03/06/15   Bobbitt, Heywood Iles, MD  norethindrone (MICRONOR,CAMILA,ERRIN) 0.35 MG tablet TAKE 1 TABLET DAILY Patient not taking: Reported on 03/06/2015 02/11/12   Kirkland Hun, MD  Olopatadine HCl (PAZEO) 0.7 % SOLN Apply 1 drop to eye daily.    [provider]  ORSYTHIA 0.1-20 MG-MCG tablet Take 1 tablet by mouth daily. 02/17/15   [provider]  Pediatric Multiple Vit-C-FA (CHILDRENS CHEWABLE VITAMINS PO) Take 1 tablet by mouth daily. Reported on 03/06/2015    [provider]      Allergies    Patient has no known allergies.    Review of Systems   Review of Systems  Constitutional:  Negative for diaphoresis and fever.  HENT:  Negative for trouble swallowing.   Eyes:  Negative for visual disturbance.  Respiratory:  Negative for cough and shortness of breath.   Cardiovascular:  Positive for chest pain.  Gastrointestinal:  Negative for abdominal pain, dysphagia, nausea and  vomiting.  Musculoskeletal:  Negative for back pain.  Skin:  Negative for rash.  Neurological:  Positive for speech difficulty, weakness and numbness. Negative for headaches.    Physical Exam Updated Vital Signs BP 104/80   Pulse 82   Temp 98.8 F (37.1 C) (Oral)   Resp 14   Ht 5\' 7"  (1.702 m)   Wt 75.8 kg   LMP 10/12/2021 (Approximate)   SpO2 100%   BMI 26.16 kg/m  Physical Exam Vitals and nursing note reviewed.  Constitutional:       General: She is not in acute distress.    Appearance: Normal appearance. She is well-developed.  HENT:     Head: Normocephalic and atraumatic.  Eyes:     Conjunctiva/sclera: Conjunctivae normal.  Cardiovascular:     Rate and Rhythm: Normal rate and regular rhythm.     Heart sounds: No murmur heard. Pulmonary:     Effort: Pulmonary effort is normal. No respiratory distress.     Breath sounds: Normal breath sounds.  Abdominal:     Palpations: Abdomen is soft.     Tenderness: There is no abdominal tenderness.  Musculoskeletal:        General: No swelling.     Cervical back: Neck supple.  Skin:    General: Skin is warm and dry.     Capillary Refill: Capillary refill takes less than 2 seconds.  Neurological:     Mental Status: She is alert.     GCS: GCS eye subscore is 4. GCS verbal subscore is 5. GCS motor subscore is 6.     Cranial Nerves: Cranial nerves 2-12 are intact.     Sensory: Sensation is intact.     Motor: Motor function is intact. No weakness, tremor or pronator drift.     Gait: Gait is intact.  Psychiatric:        Mood and Affect: Mood normal.     ED Results / Procedures / Treatments   Labs (all labs ordered are listed, but only abnormal results are displayed) Labs Reviewed  BASIC METABOLIC PANEL - Abnormal; Notable for the following components:      Result Value   Glucose, Bld 109 (*)    Creatinine, Ser 1.04 (*)    All other components within normal limits  CBC - Abnormal; Notable for the following components:   RBC 5.37 (*)    All other components within normal limits  CBG MONITORING, ED - Abnormal; Notable for the following components:   Glucose-Capillary 106 (*)    All other components within normal limits  I-STAT BETA HCG BLOOD, ED ([facility], [facility], [facility] ONLY)  TROPONIN I (HIGH SENSITIVITY)  TROPONIN I (HIGH SENSITIVITY)    EKG EKG Interpretation  Date/Time:  Friday November 02 2021 08:14:39 EDT Ventricular Rate:  72 PR Interval:  172 QRS Duration: 81 QT  Interval:  379 QTC Calculation: 415 R Axis:   78 Text Interpretation: Sinus rhythm LAE, consider biatrial enlargement No old tracing to compare Confirmed by 1971 [phone]) on 11/02/2021 8:16:35 AM  Radiology MR BRAIN WO CONTRAST  Result Date: 11/02/2021 CLINICAL DATA:  Left face and arm weakness for 2-3 days. Concern for stroke. EXAM: MRI HEAD WITHOUT CONTRAST TECHNIQUE: Multiplanar, multiecho pulse sequences of the brain and surrounding structures were obtained without intravenous contrast. COMPARISON:  None Available. FINDINGS: Brain: There is no acute intracranial hemorrhage, extra-axial fluid collection, or acute infarct. Parenchymal volume is normal. The ventricles are normal in size. Gray-white differentiation is preserved. There are  a few tiny foci of FLAIR signal abnormality in the supratentorial white matter, nonspecific. There is no suspicious parenchymal signal abnormality. There is no mass lesion. There is no mass effect or midline shift. Vascular: Normal flow voids. Skull and upper cervical spine: Normal marrow signal. Sinuses/Orbits: The paranasal sinuses are clear. The globes and orbits are unremarkable. Other: None. IMPRESSION: No acute intracranial pathology to explain the patient's symptoms. Electronically Signed   By: Lesia Hausen M.D.   On: 11/02/2021 11:49   DG Chest Port 1 View  Result Date: 11/02/2021 CLINICAL DATA:  41 year old female with chest pain, left face and arm weakness. EXAM: PORTABLE CHEST 1 VIEW COMPARISON:  None Available. FINDINGS: Portable AP semi upright view at 0845 hours. Normal lung volumes and mediastinal contours. Visualized tracheal air column is within normal limits. Allowing for portable technique the lungs are clear. No pneumothorax or pleural effusion. Mild scoliosis. Probable C7 cervical rib on the left. No acute osseous abnormality identified. Negative visible bowel gas. IMPRESSION: Negative portable chest. Electronically Signed   By: Odessa Fleming  M.D.   On: 11/02/2021 08:51    Procedures Procedures    Medications Ordered in ED Medications - No data to display  ED Course/ Medical Decision Making/ A&P Clinical Course as of 11/02/21 1855  Fri Nov 02, 2021  2440 Updated patient on current lab work and she is understanding that she is pending an MRI and a repeat troponin. [MB]  1239 Patient's MRI does not show any acute findings and patient is updated.  Awaiting second troponin.  Anticipate will be able to discharge and have her follow-up with her PCP. [MB]    Clinical Course User Index [MB] Terrilee Files, MD                           Medical Decision Making Amount and/or Complexity of Data Reviewed Labs: ordered. Radiology: ordered.  Risk Prescription drug management.   This patient complains of substernal chest pain, left hand weakness, speech difficulty; this involves an extensive number of treatment Options and is a complaint that carries with it a high risk of complications and morbidity. The differential includes stroke, bleed, hypertensive emergency, hypoglycemia, metabolic derangement, ACS, vascular  I ordered, reviewed and interpreted labs, which included CBC with normal white count normal hemoglobin, chemistries unremarkable, pregnancy test negative, troponins flat  I ordered imaging studies which included chest x-ray and MRI brain and I independently    visualized and interpreted imaging which showed no acute findings Additional history obtained from patient significant other Previous records obtained and reviewed in epic no recent admissions.  Did review urgent care visit from today also  Cardiac monitoring reviewed, normal sinus rhythm Social determinants considered, no significant barriers Critical Interventions: None  After the interventions stated above, I reevaluated the patient and found patient to be neurologically intact in no distress. Admission and further testing considered, no indications for  admission or further work-up at this time.  Recommended close follow-up with PCP.  Return instructions discussed         Final Clinical Impression(s) / ED Diagnoses Final diagnoses:  Nonspecific chest pain  Left hand weakness  Speech disturbance, unspecified type    Rx / DC Orders ED Discharge Orders     None         Terrilee Files, MD 11/02/21 1857

## 2021-11-02 NOTE — ED Triage Notes (Signed)
Patient reports weakness to the let face and left arm that started 2-3 days. Patient also reports that she began having chest pain around 0730 today. Patient went to an UC this AM and was told to come to an ED for her symptoms.

## 2021-11-02 NOTE — Discharge Instructions (Signed)
You were seen in the emergency department for some left hand weakness, difficulty with your speech, chest pain.  You had blood work EKG chest x-ray and an MRI of your brain that did not show an obvious explanation for your symptoms.  There is no evidence of a stroke or heart attack.  Please continue your regular medications and follow-up with your primary care doctor for further evaluation.  Return to the emergency department if any worsening or concerning symptoms.

## 2022-07-24 ENCOUNTER — Other Ambulatory Visit: Payer: Self-pay | Admitting: Internal Medicine

## 2022-07-24 DIAGNOSIS — Z1231 Encounter for screening mammogram for malignant neoplasm of breast: Secondary | ICD-10-CM

## 2022-08-16 ENCOUNTER — Ambulatory Visit
Admission: RE | Admit: 2022-08-16 | Discharge: 2022-08-16 | Disposition: A | Discharge status: Home / Self Care | Payer: Medicaid Other | Source: Ambulatory Visit

## 2022-08-16 DIAGNOSIS — Z1231 Encounter for screening mammogram for malignant neoplasm of breast: Secondary | ICD-10-CM

## 2023-10-14 ENCOUNTER — Other Ambulatory Visit: Payer: Self-pay | Admitting: Obstetrics and Gynecology

## 2023-10-14 DIAGNOSIS — Z1231 Encounter for screening mammogram for malignant neoplasm of breast: Secondary | ICD-10-CM

## 2023-12-19 ENCOUNTER — Ambulatory Visit: Payer: Self-pay
